# Patient Record
Sex: Female | Born: 1970 | Race: White | Hispanic: No | Marital: Married | Smoking: Never smoker
Health system: Southern US, Community
[De-identification: ages and names within clinical notes are randomized; demographics above are authoritative.]

## PROBLEM LIST (undated history)

## (undated) DIAGNOSIS — I8390 Asymptomatic varicose veins of unspecified lower extremity: Secondary | ICD-10-CM

## (undated) DIAGNOSIS — I10 Essential (primary) hypertension: Secondary | ICD-10-CM

## (undated) HISTORY — DX: Essential (primary) hypertension: I10

## (undated) HISTORY — PX: NO PAST SURGERIES: SHX2092

## (undated) HISTORY — DX: Asymptomatic varicose veins of unspecified lower extremity: I83.90

---

## 2011-10-24 ENCOUNTER — Encounter: Payer: Self-pay | Admitting: Emergency Medicine

## 2011-10-24 ENCOUNTER — Emergency Department (HOSPITAL_BASED_OUTPATIENT_CLINIC_OR_DEPARTMENT_OTHER)
Admission: EM | Admit: 2011-10-24 | Discharge: 2011-10-24 | Disposition: A | Discharge status: Home / Self Care | Payer: Medicaid Other | Attending: Emergency Medicine | Admitting: Emergency Medicine

## 2011-10-24 DIAGNOSIS — O200 Threatened abortion: Secondary | ICD-10-CM

## 2011-10-24 DIAGNOSIS — B9689 Other specified bacterial agents as the cause of diseases classified elsewhere: Secondary | ICD-10-CM

## 2011-10-24 DIAGNOSIS — A499 Bacterial infection, unspecified: Secondary | ICD-10-CM | POA: Insufficient documentation

## 2011-10-24 DIAGNOSIS — O99891 Other specified diseases and conditions complicating pregnancy: Secondary | ICD-10-CM | POA: Insufficient documentation

## 2011-10-24 DIAGNOSIS — N760 Acute vaginitis: Secondary | ICD-10-CM | POA: Insufficient documentation

## 2011-10-24 DIAGNOSIS — O2390 Unspecified genitourinary tract infection in pregnancy, unspecified trimester: Secondary | ICD-10-CM | POA: Insufficient documentation

## 2011-10-24 LAB — URINE MICROSCOPIC-ADD ON

## 2011-10-24 LAB — WET PREP, GENITAL

## 2011-10-24 LAB — URINALYSIS, ROUTINE W REFLEX MICROSCOPIC
Bilirubin Urine: NEGATIVE
Hgb urine dipstick: NEGATIVE
Specific Gravity, Urine: 1.019 (ref 1.005–1.030)
pH: 6.5 (ref 5.0–8.0)

## 2011-10-24 MED ORDER — NITROFURANTOIN MACROCRYSTAL 100 MG PO CAPS
100.0000 mg | ORAL_CAPSULE | Freq: Four times a day (QID) | ORAL | Status: DC
  Filled 2011-10-24: qty 1

## 2011-10-24 MED ORDER — ONDANSETRON 4 MG PO TBDP: 4.0000 mg | ORAL_TABLET | Freq: Three times a day (TID) | ORAL | Status: AC | PRN

## 2011-10-24 MED ORDER — NITROFURANTOIN MACROCRYSTAL 100 MG PO CAPS: 100.0000 mg | ORAL_CAPSULE | Freq: Two times a day (BID) | ORAL | Status: AC

## 2011-10-24 MED ORDER — NITROFURANTOIN MONOHYD MACRO 100 MG PO CAPS
ORAL_CAPSULE | ORAL | Status: AC
  Administered 2011-10-24: 100 mg
  Filled 2011-10-24: qty 1

## 2011-10-24 MED ORDER — METRONIDAZOLE 500 MG PO TABS: 500.0000 mg | ORAL_TABLET | Freq: Three times a day (TID) | ORAL | Status: AC

## 2011-10-24 MED ORDER — ONDANSETRON 8 MG PO TBDP
8.0000 mg | ORAL_TABLET | ORAL | Status: AC
  Administered 2011-10-24: 8 mg via ORAL
  Filled 2011-10-24: qty 1

## 2011-10-24 NOTE — ED Notes (Signed)
Pt reports lower abd pain and nausea with pregnancy denies any bleeding

## 2011-10-24 NOTE — ED Notes (Signed)
Care plan reviewed with Pt will return as needed

## 2011-10-24 NOTE — ED Provider Notes (Signed)
History     CSN: [id number] Arrival date & time: 10/24/2011 10:52 AM   First MD Initiated Contact with Patient 10/24/11 1148      Chief Complaint  Patient presents with  . Emesis During Pregnancy    pt reports lower abd pain and cramping with nausea reports she is 2 Months pregnant no prenatal care    (Consider location/radiation/quality/duration/timing/severity/associated sxs/prior treatment) HPI Patient presents with complaint of lower abdominal pain. She states her last menstrual period was August 15. She knows that she is pregnant. She does not have any prenatal care. She denies any vaginal bleeding. Abdominal pain began 2 days ago and has been constant and dull in nature. Pain is described as mild to moderate in severity. She has had intermittent vomiting but is able to keep down liquids. She denies any fever. She denies any dysuria. Patient is G6 P4 A1- hx of one prior miscarriage  History reviewed. No pertinent past medical history.  History reviewed. No pertinent past surgical history.  History reviewed. No pertinent family history.  History  Substance Use Topics  . Smoking status: Never Smoker   . Smokeless tobacco: Not on file  . Alcohol Use: No    OB History    Grav Para Term Preterm Abortions TAB SAB Ect Mult Living   1               Review of Systems ROS reviewed and otherwise negative except for mentioned in HPI  Allergies  Review of patient's allergies indicates no known allergies.  Home Medications   Current Outpatient Rx  Name Route Sig Dispense Refill  . METRONIDAZOLE 500 MG PO TABS Oral Take 1 tablet (500 mg total) by mouth 3 (three) times daily. 21 tablet 0  . NITROFURANTOIN MACROCRYSTAL 100 MG PO CAPS Oral Take 1 capsule (100 mg total) by mouth 2 (two) times daily. 14 capsule 0  . ONDANSETRON 4 MG PO TBDP Oral Take 1 tablet (4 mg total) by mouth every 8 (eight) hours as needed for nausea. 20 tablet 0    BP 127/86  Pulse 79  Temp 98.4 F  (36.9 C)  Resp 22  Wt 157 lb (71.215 kg)  SpO2 100%  LMP 07/26/2011 Vitals reviewed Physical Exam Physical Examination: Physical Examination: General appearance - alert, well appearing, and in no distress Mental status - alert, oriented to person, place, and time Mouth - mucous membranes moist, pharynx normal without lesions Chest - clear to auscultation, no wheezes, rales or rhonchi, symmetric air entry Heart - normal rate, regular rhythm, normal S1, S2, no murmurs, rubs, clicks or gallops Abdomen - soft, nondistended, no masses or organomegaly, mild ttp diffusely over lower abdomen- suprapubic and bilaterally, no gaurding or rebound Pelvic - normal external genitalia, vulva, cervical os closed, no blood, uterus enlarged/gravid, no adnexal tenderness, fundal tenderness or CMT Back exam - full range of motion, no tenderness, palpable spasm or pain on motion, no midline ttp , no CVA tenderness Neurological - alert, oriented, normal speech, no focal findings or movement disorder noted, motor and sensory grossly normal bilaterally Musculoskeletal - no joint tenderness, deformity or swelling Extremities - peripheral pulses normal, no pedal edema, no clubbing or cyanosis Skin - normal coloration and turgor, no rashes, no suspicious skin lesions noted  ED Course  Procedures (including critical care time)  Labs Reviewed  URINALYSIS, ROUTINE W REFLEX MICROSCOPIC - Abnormal; Notable for the following:    Appearance CLOUDY (*)    Leukocytes, UA MODERATE (*)  All other components within normal limits  URINE MICROSCOPIC-ADD ON - Abnormal; Notable for the following:    Squamous Epithelial / LPF FEW (*)    Bacteria, UA MANY (*)    All other components within normal limits  WET PREP, GENITAL - Abnormal; Notable for the following:    Yeast, Wet Prep FEW (*)    Clue Cells, Wet Prep MODERATE (*)    WBC, Wet Prep HPF POC MANY (*)    All other components within normal limits  PREGNANCY, URINE    GC/CHLAMYDIA PROBE AMP, GENITAL  URINE CULTURE   No results found.   1. Threatened miscarriage   2. Bacterial vaginosis   3. Asymptomatic bacteriuria in pregnancy    EMERGENCY DEPARTMENT [place] PREGNANCY "Study: Limited Ultrasound of the Pelvis"  INDICATIONS:Pregnancy(required) and Pelvic pain Multiple views of the uterus and pelvic cavity are obtained with a multi-frequency probe.  APPROACH:Transabdominal   PERFORMED BY: Myself  IMAGES ARCHIVED?: Yes  LIMITATIONS: Emergent procedure  PREGNANCY FREE FLUID: None  PREGNANCY UTERUS FINDINGS:Uterus enlarged ADNEXAL FINDINGS:Left ovary not seen and Right ovary not seen  PREGNANCY FINDINGS: Intrauterine gestational sac noted, Fetal pole present and Fetal heart activity seen  INTERPRETATION: Viable intrauterine pregnancy  GESTATIONAL AGE, ESTIMATE: 14 weeks 5 days  FETAL HEART RATE: 143  COMMENT(Estimate of Gestational Age):  Viable IUP with no other acute abnormalities found     MDM  Pt presenting with c/o pregnancy and lower abdominal pain.  Bedside ultrasound performed by me shows +IUP at 14weeks 5 days, FHR 143.  Cervical os closed on pelvic exam.  Pt with asymptomatic bacteriuria- started on macrobid- urine culture sent.  Wet prep also c/o BV- started on flagyl.  Strongly recommended pt start prenatal care- given f/u information.  Discharged with strict return precautions.  Pt agreeable with plan.        Ethelda Chick, MD 10/24/11 [phone]

## 2011-10-26 LAB — URINE CULTURE: Culture  Setup Time: 201211111046

## 2014-10-15 ENCOUNTER — Encounter: Payer: Self-pay | Admitting: Emergency Medicine

## 2016-09-03 ENCOUNTER — Encounter: Payer: Self-pay | Admitting: Obstetrics and Gynecology

## 2016-09-03 ENCOUNTER — Other Ambulatory Visit: Payer: Self-pay

## 2016-09-03 ENCOUNTER — Ambulatory Visit (INDEPENDENT_AMBULATORY_CARE_PROVIDER_SITE_OTHER): Payer: Medicaid Other | Admitting: Obstetrics and Gynecology

## 2016-09-03 VITALS — BP 121/82 | HR 84 | Ht 67.0 in | Wt 185.4 lb

## 2016-09-03 DIAGNOSIS — R928 Other abnormal and inconclusive findings on diagnostic imaging of breast: Secondary | ICD-10-CM | POA: Insufficient documentation

## 2016-09-03 DIAGNOSIS — O99210 Obesity complicating pregnancy, unspecified trimester: Secondary | ICD-10-CM | POA: Insufficient documentation

## 2016-09-03 DIAGNOSIS — O10912 Unspecified pre-existing hypertension complicating pregnancy, second trimester: Secondary | ICD-10-CM | POA: Diagnosis not present

## 2016-09-03 DIAGNOSIS — O09529 Supervision of elderly multigravida, unspecified trimester: Secondary | ICD-10-CM | POA: Insufficient documentation

## 2016-09-03 DIAGNOSIS — O30002 Twin pregnancy, unspecified number of placenta and unspecified number of amniotic sacs, second trimester: Secondary | ICD-10-CM

## 2016-09-03 DIAGNOSIS — O30049 Twin pregnancy, dichorionic/diamniotic, unspecified trimester: Secondary | ICD-10-CM | POA: Insufficient documentation

## 2016-09-03 DIAGNOSIS — O09522 Supervision of elderly multigravida, second trimester: Secondary | ICD-10-CM | POA: Diagnosis not present

## 2016-09-03 DIAGNOSIS — I10 Essential (primary) hypertension: Secondary | ICD-10-CM | POA: Insufficient documentation

## 2016-09-03 LAB — POCT URINALYSIS DIP (DEVICE)
Bilirubin Urine: NEGATIVE
GLUCOSE, UA: NEGATIVE mg/dL
KETONES UR: NEGATIVE mg/dL
LEUKOCYTES UA: NEGATIVE
Nitrite: NEGATIVE
Protein, ur: NEGATIVE mg/dL
SPECIFIC GRAVITY, URINE: 1.02 (ref 1.005–1.030)
Urobilinogen, UA: 1 mg/dL (ref 0.0–1.0)
pH: 7 (ref 5.0–8.0)

## 2016-09-03 MED ORDER — ASPIRIN 81 MG PO CHEW: 81.0000 mg | CHEWABLE_TABLET | Freq: Every day | ORAL | 3 refills | Status: AC

## 2016-09-03 MED ORDER — FOLIC ACID 1 MG PO TABS: 1.0000 mg | ORAL_TABLET | Freq: Every day | ORAL | 3 refills | Status: DC

## 2016-09-03 NOTE — Progress Notes (Signed)
New OB Note  09/03/2016   Clinic: [facility]  Chief Complaint: NOB  Transfer of Care Patient: Yes, Dr[facility] Shawnie Pons of [facility]  History of Present Illness: Lori Peck is a 45 y.o. [id number] @ 17/6 weeks (EDC 2/23, based on Patient's last menstrual period was 05/01/2016 (exact date).=13wk u/s), with the above CC. Preg complicated by has Supervision of high-risk pregnancy of elderly multigravida; Multiple pregnancy; Chronic hypertension; and Obesity in pregnancy on her problem list.   She was using no method when she conceived.  She has Negative signs or symptoms of nausea/vomiting of pregnancy. She has Negative signs or symptoms of miscarriage or preterm labor She identifies Negative Zika risk factors for her and her partner On any different medications around the time she conceived/early pregnancy: No   ROS: A 12-point review of systems was performed and negative, except as stated in the above HPI.  OBGYN History: As per HPI. OB History  Gravida Para Term Preterm AB Living  7 5 5   1 4   SAB TAB Ectopic Multiple Live Births  1       5    # Outcome Date GA Lbr Len/2nd Weight Sex Delivery Anes PTL Lv  7 Current           6 SAB 11/2015 [redacted]w[redacted]d         5 Term 04/15/12 [redacted]w[redacted]d  5 lb (2.268 kg) F Vag-Spont EPI N LIV  4 Term 12/16/07 [redacted]w[redacted]d  7 lb (3.175 kg) F Vag-Spont None N LIV  3 Term 12/15/98 [redacted]w[redacted]d  6 lb (2.722 kg) F Vag-Spont  N LIV  2 Term 06/02/96 [redacted]w[redacted]d  5 lb (2.268 kg) M Vag-Spont   LIV  1 Term 12/01/94 [redacted]w[redacted]d  7 lb (3.175 kg) M Vag-Spont   DEC      Any issues with any prior pregnancies: no Any prior children are healthy, doing well, without any problems or issues: yes History of pap smears: Yes. Last pap smear 2017, negative. Abnormal: No h/o  History of STIs: No   Past Medical History: Past Medical History:  Diagnosis Date  . Hypertension   . Varicosities of leg     Past Surgical History: Past Surgical History:  Procedure Laterality Date  . NO PAST  SURGERIES      Family History:  History reviewed. No pertinent family history. She denies any history of mental retardation, birth defects or genetic disorders in her or the FOB's history  Social History:  Social History   Social History  . Marital status: Married    Spouse name: N/A  . Number of children: N/A  . Years of education: N/A   Occupational History  . Not on file.   Social History Main Topics  . Smoking status: Never Smoker  . Smokeless tobacco: Never Used  . Alcohol use No  . Drug use: No  . Sexual activity: Yes    Birth control/ protection: None   Other Topics Concern  . Not on file   Social History Narrative  . 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 Fundal  height: not applicable FHTs: normal x2  General appearance: Well nourished, well developed female in no acute distress.   Pelvic exam: is not limited by body habitus EGBUS: within normal limits, Vagina: within normal limits and with no blood in the vault, Cervix: normal appearing cervix without discharge or lesions, closed/long/high, Uterus:  enlarged, c/w 16 week size, and Adnexa:  normal adnexa and no mass, fullness, tenderness  Laboratory: Outside records @ dr. Shawnie Ponsorn: negative [place], UCx, early 1hr (122). O pos/RI/hepB neg/rpr neg/hiv neg/pap neg Patient went to er on 9/3 for decreased FM and negative u/a, cbc, cmp  Imaging:  As above  Assessment: pt doing well  Plan: 1. Twin gestation in second trimester, unspecified placenta and amniotic sac number Routine care. Unsure of placentation (ER stated ?mo-di) and no formal u/s done. Extra FA added. She had harmony testing  but cffdna isn't recommended for twins so will do quad screen. Routine AP care based on u/s.  - US MFM OB COMP + 14 WK; Future - US MFM OB COMP ADDL GEST + 14 WK; Future  2. Supervision of high-risk pregnancy of elderly multigravida Will get maternal screening echo and EKG. Start baby asa today. PC ratio today. Advised TED hoses and VTE precautions, s/s given. Will defer to mfm to see if she needs fetal echo.  - AMB referral to maternal fetal medicine - AFP, Quad Screen - EKG 12-Lead - ECHOCARDIOGRAM COMPLETE; Future  3. Chronic hypertension Continue hctz 25mg  qday. See above  4. Obesity in pregnancy See above  5. History of abnormal breast u/s 03/2016 [facility] one recommended f/u in 4368m which she hasn't had done.  - US BREAST COMPLETE UNI LEFT INC AXILLA; Future  Problem list reviewed and updated.  Follow up in 2 weeks.  >50% of 35 min visit spent on counseling and coordination of care.     Lori Copaharlie Ledia Peck, Jr. MD Attending [facility](Faculty Practice)

## 2016-09-03 NOTE — Progress Notes (Signed)
Declines flu vaccine today.

## 2016-09-03 NOTE — Progress Notes (Signed)
Breast US scheduled for October 6th @ 0840 ROI filled out and faxed to [facility] for breast ultrasound records.

## 2016-09-07 LAB — AFP, QUAD SCREEN
AFP: 96.8 ng/mL
Curr Gest Age: 17.9 weeks
HCG TOTAL: 37.5 [IU]/mL
INH: 524.9 pg/mL
Interpretation-AFP: POSITIVE — AB
MOM FOR HCG: 1.48
MoM for AFP: 2.62
MoM for INH: 3.4
Open Spina bifida: NEGATIVE
UE3 MOM: 1.12
UE3 VALUE: 1.35 ng/mL

## 2016-09-08 ENCOUNTER — Encounter (HOSPITAL_COMMUNITY): Payer: Self-pay | Admitting: Obstetrics and Gynecology

## 2016-09-08 ENCOUNTER — Encounter: Payer: Self-pay | Admitting: Obstetrics and Gynecology

## 2016-09-08 DIAGNOSIS — O280 Abnormal hematological finding on antenatal screening of mother: Secondary | ICD-10-CM | POA: Insufficient documentation

## 2016-09-15 ENCOUNTER — Encounter (HOSPITAL_COMMUNITY): Payer: Self-pay

## 2016-09-15 ENCOUNTER — Other Ambulatory Visit: Payer: Self-pay | Admitting: Obstetrics and Gynecology

## 2016-09-15 ENCOUNTER — Ambulatory Visit (HOSPITAL_COMMUNITY)
Admission: RE | Admit: 2016-09-15 | Discharge: 2016-09-15 | Disposition: A | Discharge status: Home / Self Care | Payer: Medicaid Other | Source: Ambulatory Visit | Attending: Obstetrics and Gynecology | Admitting: Obstetrics and Gynecology

## 2016-09-15 ENCOUNTER — Other Ambulatory Visit (HOSPITAL_COMMUNITY): Payer: Medicaid Other

## 2016-09-15 DIAGNOSIS — O30002 Twin pregnancy, unspecified number of placenta and unspecified number of amniotic sacs, second trimester: Secondary | ICD-10-CM

## 2016-09-15 DIAGNOSIS — Z315 Encounter for genetic counseling: Secondary | ICD-10-CM | POA: Diagnosis present

## 2016-09-15 DIAGNOSIS — Z363 Encounter for antenatal screening for malformations: Secondary | ICD-10-CM

## 2016-09-15 DIAGNOSIS — O10919 Unspecified pre-existing hypertension complicating pregnancy, unspecified trimester: Secondary | ICD-10-CM

## 2016-09-15 DIAGNOSIS — O09522 Supervision of elderly multigravida, second trimester: Secondary | ICD-10-CM | POA: Diagnosis not present

## 2016-09-15 DIAGNOSIS — Z3A19 19 weeks gestation of pregnancy: Secondary | ICD-10-CM

## 2016-09-15 DIAGNOSIS — O30042 Twin pregnancy, dichorionic/diamniotic, second trimester: Secondary | ICD-10-CM | POA: Insufficient documentation

## 2016-09-15 DIAGNOSIS — O09529 Supervision of elderly multigravida, unspecified trimester: Secondary | ICD-10-CM

## 2016-09-15 NOTE — Progress Notes (Signed)
Genetic Counseling  High-Risk Gestation Note  Appointment Date:  09/15/2016 Referred By: Sagamore BingPickens, Charlie, MD Date of Birth:  1971-09-13   Pregnancy History: [id number] Estimated Date of Delivery: 02/05/17 Estimated Gestational Age: 3559w4d Attending: Particia NearingMartha Decker, MD   Lori. Lori Peck  was seen for genetic counseling because of a maternal age of 45 y.o. in a dichorionic/diamniotic twin gestation.     In summary:  Discussed increased risk for fetal aneuploidy with advanced maternal age  Reviewed results of patient's NIPS and ultrasound  Patient also previously had Quad screen, which was screen positive for Down syndrome but reduced age -related risk  Reviewed that while both have decreased sensitivity in twin gestation, NIPS has better accuracy than Quad screen for fetal aneuploidy  Discussed limitations of screening and option of diagnostic testing  Declined amniocentesis  Advanced paternal age - 47 years old  Reviewed carrier screening options- patient declined detailed discussion   She was counseled regarding maternal age and the association with risk for chromosome conditions due to nondisjunction with aging of the ova.   We reviewed chromosomes, nondisjunction, and the associated 1 in 11 risk for fetal aneuploidy related to a maternal age of 45 y.o. at 4759w4d gestation.  She was counseled that the risk for aneuploidy decreases as gestational age increases, accounting for those pregnancies which spontaneously abort.  We specifically discussed Down syndrome (trisomy 6121), trisomies 3413 and 8118, and sex chromosome aneuploidies (47,XXX and 47,XXY) including the common features and prognoses of each.   We also reviewed the results of Lori Peck's non-invasive prenatal screening (NIPS) and ultrasound.  We discussed that NIPS analyzes placental cell free DNA in maternal circulation to evaluate for the presence of extra chromosome conditions.  Thus, it is able to provide risk assessment for  specific chromosome conditions, but is not diagnostic.  Specifically, Lori. Carney Peck had [facility] through [facility] with Y analysis.  Based on this test, the chance for the pregnancy to have aneuploidy for chromosomes 13, 18 or 21 was reduced to less than 1 in [id number].  This screen did not evaluate for the presence of extra or missing DNA from the X chromosome due to the twin gestation, thus the chance for fetal X chromosome aneuploidy was not evaluated. We reviewed that this screen is highly sensitive and specific for these conditions but is not diagnostic. We reviewed that the detection rate is lower in a twin gestation compared to a singleton, but the detection rate for Down syndrome is approximately 90% in twin gestations.  She was counseled that 50-80% of fetuses with Down syndrome and up to 90% of fetuses with trisomies 13 and 18, when well visualized, have detectable anomalies or soft markers by ultrasound.   Quad screen was performed through the patient's OB office. We reviewed these results, which were screen positive for Down syndrome but reduced the risk from the patient's age related risk (1 in 4714 to 1 in 7171). We reviewed that screening adjusts the patient's a priori age related risk for specific conditions to provide a pregnancy specific estimate. It is not considered diagnostic and does not assess for all chromosome conditions. We reviewed that the detection rate for Down syndrome in a singleton pregnancy is approximately 75-80%, but that this is reduced in a twin gestation. Trisomy 18 risk is not assessed from Quad screen in a twin gestation. Thus, we reviewed that while neither NIPS nor Quad screen is diagnostic and does not assess for all chromosome conditions, NIPS has a higher  sensitivity and higher specificity for these chromosome conditions compared to Quad screen.   Detailed ultrasound was performed today. Visualized fetal anatomy appeared within normal limits.  Complete ultrasound results  reported under separate cover.   We also discussed the availability of diagnostic testing by way of amniocentesis.  We reviewed the risks, benefits and limitations of amniocentesis including the approximate 1 in 300-500 risk for pregnancy complications following amniocentesis. We discussed the possible results that the tests might provide including: positive, negative, unanticipated, and no result. Finally, they were counseled regarding the cost of each option and potential out of pocket expenses. After reviewing the above results and the available options, Lori Peck expressed that she is not interested in pursuing diagnostic testing at this time or in the future, given the associated risk of complications.  She understands that ultrasound and NIPS cannot rule out all birth defects or genetic syndromes.    Lori Peck was provided with written information regarding cystic fibrosis (CF), spinal muscular atrophy (SMA) and hemoglobinopathies including the carrier frequency, availability of carrier screening and prenatal diagnosis if indicated.  In addition, we discussed that CF and hemoglobinopathies are routinely screened for as part of the [place] newborn screening panel.  She declined screening for CF, SMA and hemoglobinopathies.  Both family histories were reviewed and found to be noncontributory for birth defects, intellectual disability, and known genetic conditions. Without further information regarding the provided family history, an accurate genetic risk cannot be calculated. Further genetic counseling is warranted if more information is obtained.  The father of the pregnancy is reportedly 65 years old. We discussed that advanced paternal age (APA) is defined as paternal age greater than or equal to age 9.  Recent large-scale sequencing studies have shown that approximately 80% of de novo point mutations are of paternal origin.  Many studies have demonstrated a strong correlation between  increased paternal age and de novo point mutations.  Although no specific data is available regarding fetal risks for fathers 62+ years old at conception, it is apparent that the overall risk for single gene conditions is increased.  To estimate the relative increase in risk of a genetic disorder with APA, the heritability of the disease must be considered.  Assuming an approximate 2x increase in risk for conditions that are exclusively paternal in origin, the risk for each individual condition is still relatively low.  It is estimated that the overall chance for a de novo mutation is ~0.5%.  We also discussed the wide range of conditions which can be caused by new dominant gene mutations (achondroplasia, neurofibromatosis, Marfan syndrome etc.).  She was counseled that genetic testing for each individual single gene condition is not warranted or available unless ultrasound or family history concerns lend suspicion to a specific condition.    In addition, she was counseled that some literature suggests that APA is also associated with an increase in risk for fetal aneuploidy.  While other literature does not support this, we discussed that a specific risk for aneuploidy other than that based on maternal age cannot be quantified.  We again reviewed the NIPS result that was previously performed for Lori Peck, which was within normal range for Down syndrome, trisomy 26, and trisomy 65. Lastly, we discussed that newer literature suggests that the risk for autism spectrum disorders (ASD) may be increased in children born to fathers of APA.  We discussed that ASDs are among the most common neurodevelopmental disorders, with approximately 1 in 85 children meeting criteria  for ASD.  Approximately 80% of individuals diagnosed are female.  There is strong evidence that genetic factors play a critical role in development of ASD.  While there have been recent advances in identifying specific genetic causes of ASD, there are  still many individuals for whom the etiology of the ASD is not known.  They understand that at this time there is no reliable, comprehensive genetic testing available for ASD.     Lori Peck denied exposure to environmental toxins or chemical agents. She denied the use of alcohol, tobacco or street drugs. She denied significant viral illnesses during the course of her pregnancy. Her medical and surgical histories were noncontributory.   I counseled Lori Peck regarding the above risks and available options.  The approximate face-to-face time with the genetic counselor was 40 minutes.  Quinn Plowman, Lori,  Certified Genetic Counselor 09/15/2016

## 2016-09-16 ENCOUNTER — Encounter: Payer: Self-pay | Admitting: Obstetrics and Gynecology

## 2016-09-16 ENCOUNTER — Other Ambulatory Visit (HOSPITAL_COMMUNITY): Payer: Medicaid Other

## 2016-09-16 ENCOUNTER — Other Ambulatory Visit (HOSPITAL_COMMUNITY): Payer: Self-pay | Admitting: *Deleted

## 2016-09-16 DIAGNOSIS — O09529 Supervision of elderly multigravida, unspecified trimester: Secondary | ICD-10-CM | POA: Insufficient documentation

## 2016-09-16 DIAGNOSIS — O30049 Twin pregnancy, dichorionic/diamniotic, unspecified trimester: Secondary | ICD-10-CM

## 2016-09-17 ENCOUNTER — Other Ambulatory Visit (HOSPITAL_COMMUNITY): Payer: Self-pay

## 2016-09-18 ENCOUNTER — Other Ambulatory Visit: Payer: Self-pay | Admitting: Obstetrics and Gynecology

## 2016-09-18 ENCOUNTER — Ambulatory Visit
Admission: RE | Admit: 2016-09-18 | Discharge: 2016-09-18 | Disposition: A | Discharge status: Home / Self Care | Payer: Medicaid Other | Source: Ambulatory Visit | Attending: Obstetrics and Gynecology | Admitting: Obstetrics and Gynecology

## 2016-09-18 DIAGNOSIS — O30002 Twin pregnancy, unspecified number of placenta and unspecified number of amniotic sacs, second trimester: Secondary | ICD-10-CM

## 2016-09-18 DIAGNOSIS — R599 Enlarged lymph nodes, unspecified: Secondary | ICD-10-CM

## 2016-09-18 DIAGNOSIS — R928 Other abnormal and inconclusive findings on diagnostic imaging of breast: Secondary | ICD-10-CM

## 2016-09-21 ENCOUNTER — Other Ambulatory Visit: Payer: Self-pay | Admitting: Obstetrics and Gynecology

## 2016-09-21 ENCOUNTER — Ambulatory Visit
Admission: RE | Admit: 2016-09-21 | Discharge: 2016-09-21 | Disposition: A | Discharge status: Home / Self Care | Payer: Medicaid Other | Source: Ambulatory Visit | Attending: Obstetrics and Gynecology | Admitting: Obstetrics and Gynecology

## 2016-09-21 DIAGNOSIS — O30002 Twin pregnancy, unspecified number of placenta and unspecified number of amniotic sacs, second trimester: Secondary | ICD-10-CM

## 2016-09-21 DIAGNOSIS — R599 Enlarged lymph nodes, unspecified: Secondary | ICD-10-CM

## 2016-09-23 ENCOUNTER — Telehealth: Payer: Self-pay | Admitting: *Deleted

## 2016-09-23 DIAGNOSIS — R928 Other abnormal and inconclusive findings on diagnostic imaging of breast: Secondary | ICD-10-CM

## 2016-09-23 NOTE — Telephone Encounter (Addendum)
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 States this shows there is an inflammatory process going on - could be TB or sarcoidosis. Recommend blood test  For tb and whatever other follow up Dr. Vergie LivingPickens reccommends. Will forward this as high priority to Dr. Vergie LivingPickens and Dr. Alysia PennaErvin as patient in scheduled to come in tomorrow. Put notes on her appt we need to do blood test and discuss results/ plan of care with pt. Also updated problem list. [facility] will call patient to discuss with her results and to follow up with our office.

## 2016-09-24 ENCOUNTER — Other Ambulatory Visit: Payer: Self-pay

## 2016-09-24 ENCOUNTER — Ambulatory Visit (HOSPITAL_COMMUNITY): Payer: Medicaid Other | Attending: Cardiology

## 2016-09-24 ENCOUNTER — Ambulatory Visit (INDEPENDENT_AMBULATORY_CARE_PROVIDER_SITE_OTHER): Payer: Medicaid Other | Admitting: Obstetrics and Gynecology

## 2016-09-24 ENCOUNTER — Telehealth: Payer: Self-pay | Admitting: Internal Medicine

## 2016-09-24 VITALS — BP 131/76 | Wt 189.4 lb

## 2016-09-24 DIAGNOSIS — I10 Essential (primary) hypertension: Secondary | ICD-10-CM

## 2016-09-24 DIAGNOSIS — O10912 Unspecified pre-existing hypertension complicating pregnancy, second trimester: Secondary | ICD-10-CM

## 2016-09-24 DIAGNOSIS — O30002 Twin pregnancy, unspecified number of placenta and unspecified number of amniotic sacs, second trimester: Secondary | ICD-10-CM | POA: Insufficient documentation

## 2016-09-24 DIAGNOSIS — O09529 Supervision of elderly multigravida, unspecified trimester: Secondary | ICD-10-CM | POA: Insufficient documentation

## 2016-09-24 DIAGNOSIS — Z3A28 28 weeks gestation of pregnancy: Secondary | ICD-10-CM | POA: Insufficient documentation

## 2016-09-24 DIAGNOSIS — O30042 Twin pregnancy, dichorionic/diamniotic, second trimester: Secondary | ICD-10-CM

## 2016-09-24 DIAGNOSIS — O09522 Supervision of elderly multigravida, second trimester: Secondary | ICD-10-CM

## 2016-09-24 DIAGNOSIS — R590 Localized enlarged lymph nodes: Secondary | ICD-10-CM

## 2016-09-24 LAB — ECHOCARDIOGRAM COMPLETE
AVLVOTPG: 8 mmHg
CHL CUP RV SYS PRESS: 31 mmHg
E decel time: 197 msec
E/e' ratio: 7.33
FS: 25 % — AB (ref 28–44)
IVS/LV PW RATIO, ED: 0.88
LA diam index: 1.93 cm/m2
LA vol A4C: 65 ml
LA vol: 76 mL
LASIZE: 38 mm
LAVOLIN: 38.6 mL/m2
LDCA: 3.14 cm2
LEFT ATRIUM END SYS DIAM: 38 mm
LV E/e'average: 7.33
LV PW d: 11 mm — AB (ref 0.6–1.1)
LV TDI E'MEDIAL: 7.35
LVEEMED: 7.33
LVELAT: 15 cm/s
LVOT VTI: 24.4 cm
LVOT diameter: 20 mm
LVOTPV: 139 cm/s
LVOTSV: 77 mL
MV Dec: 197
MV pk E vel: 110 m/s
MVPG: 5 mmHg
MVPKAVEL: 71.6 m/s
Reg peak vel: 263 cm/s
TDI e' lateral: 15
TR max vel: 263 cm/s

## 2016-09-24 MED ORDER — FOLIC ACID 1 MG PO TABS: 1.0000 mg | ORAL_TABLET | Freq: Every day | ORAL | 3 refills | Status: AC

## 2016-09-24 NOTE — Progress Notes (Signed)
[facility] to [facility] demographics, medical records pertaining to diagnoses to Clydie BraunKaren @ [phone].  [facility] to contact pt in regards to appt.

## 2016-09-24 NOTE — Telephone Encounter (Signed)
Was called by obstetrician who mentioned that the patient underwent LN needle biopsy of L axillary node found to have focal necrotizing granulmoatous lymphadenitis in patient who is 44yo F @ [redacted] wks gestation with twins. 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 No recent CXR. Patient reportedly does not have any pulmonary symptoms.  I have contacted the [facility], TB team, and they will start investigation and assess for active/ extra pulm TB.  In the meantime, since no tissue was sent for culture, I have recommended that the patient undergo excisional LN biopsy to have tissue sent for AFB and fungal culture. Will contact [facility] to see if they will be able to fulfill our request sooner than later in order to decide if patient start treatment during pregnancy

## 2016-09-24 NOTE — Progress Notes (Signed)
Declined Flu 

## 2016-09-24 NOTE — Progress Notes (Signed)
Subjective:  [place] is a 45 y.o. [id number] at 3148w6d being seen today for ongoing prenatal care.  She is currently monitored for the following issues for this high-risk pregnancy and has Supervision of high-risk pregnancy of elderly multigravida; Dichorionic diamniotic twin gestation; Chronic hypertension; Obesity in pregnancy; Abnormal quad screen; AMA (advanced maternal age) multigravida 35+; and Axillary adenopathy on her problem list. 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 States treated for 6 months. She currently denies any S/Sx of TB or illness.   Patient reports no complaints.  Contractions: Irritability.  .  Movement: Present. Denies leaking of fluid.   The following portions of the patient's history were reviewed and updated as appropriate: allergies, current medications, past family history, past medical history, past social history, past surgical history and problem list. Problem list updated.  Objective:   Vitals:   09/24/16 1248  BP: 131/76  Weight: 189 lb 6.4 oz (85.9 kg)    Fetal Status: Fetal Heart Rate (bpm): 146/150   Movement: Present     General:  Alert, oriented and cooperative. Patient is in no acute distress.  Skin: Skin is warm and dry. No rash noted.   Cardiovascular: Normal heart rate noted  Respiratory: Normal respiratory effort, no problems with respiration noted  Abdomen: Soft, gravid, appropriate for gestational age. Pain/Pressure: Present     Pelvic:  Cervical exam deferred        Extremities: Normal range of motion.  Edema: None  Mental Status: Normal mood and affect. Normal behavior. Normal judgment and thought content.   Urinalysis:      Assessment and Plan:  Pregnancy: [id number] at 4748w6d  1. Supervision of high-risk pregnancy of elderly multigravida   2. Dichorionic diamniotic twin pregnancy in second trimester   3. Chronic hypertension Stable on current treatment Stressed importance of taking BASA 4. Elderly  multigravida in second trimester   5. Axillary adenopathy FNA completed on 09/21/16. Concern for granulomatous lymphadenitis. Discussed with pathology. TB PCR testing added. Discussed with Id. [facility] will contact pt regarding treatment and additional testing. ID also advised referral to [facility] for excision of axillary lymph node for culture. POC reviewed and discussed with pt  Preterm labor symptoms and general obstetric precautions including but not limited to vaginal bleeding, contractions, leaking of fluid and fetal movement were reviewed in detail with the patient. Please refer to After Visit Summary for other counseling recommendations.  Return in about 4 weeks (around 10/22/2016).   Hermina StaggersMichael L Aloria Looper, MD

## 2016-10-13 ENCOUNTER — Encounter (HOSPITAL_COMMUNITY): Payer: Self-pay

## 2016-10-13 ENCOUNTER — Ambulatory Visit (HOSPITAL_COMMUNITY)
Admission: RE | Admit: 2016-10-13 | Discharge: 2016-10-13 | Disposition: A | Discharge status: Home / Self Care | Payer: Medicaid Other | Source: Ambulatory Visit | Attending: Obstetrics and Gynecology | Admitting: Obstetrics and Gynecology

## 2016-10-13 DIAGNOSIS — Z3A23 23 weeks gestation of pregnancy: Secondary | ICD-10-CM | POA: Diagnosis not present

## 2016-10-13 DIAGNOSIS — O30042 Twin pregnancy, dichorionic/diamniotic, second trimester: Secondary | ICD-10-CM | POA: Insufficient documentation

## 2016-10-13 DIAGNOSIS — O09522 Supervision of elderly multigravida, second trimester: Secondary | ICD-10-CM | POA: Insufficient documentation

## 2016-10-13 DIAGNOSIS — O30049 Twin pregnancy, dichorionic/diamniotic, unspecified trimester: Secondary | ICD-10-CM

## 2016-10-14 ENCOUNTER — Other Ambulatory Visit (HOSPITAL_COMMUNITY): Payer: Self-pay | Admitting: *Deleted

## 2016-10-14 DIAGNOSIS — O30049 Twin pregnancy, dichorionic/diamniotic, unspecified trimester: Secondary | ICD-10-CM

## 2016-10-22 ENCOUNTER — Ambulatory Visit (INDEPENDENT_AMBULATORY_CARE_PROVIDER_SITE_OTHER): Payer: Medicaid Other | Admitting: Family

## 2016-10-22 ENCOUNTER — Encounter: Payer: Medicaid Other | Admitting: Obstetrics and Gynecology

## 2016-10-22 VITALS — BP 126/52 | HR 95 | Wt 193.5 lb

## 2016-10-22 DIAGNOSIS — R590 Localized enlarged lymph nodes: Secondary | ICD-10-CM

## 2016-10-22 DIAGNOSIS — O10912 Unspecified pre-existing hypertension complicating pregnancy, second trimester: Secondary | ICD-10-CM

## 2016-10-22 DIAGNOSIS — Z8611 Personal history of tuberculosis: Secondary | ICD-10-CM

## 2016-10-22 DIAGNOSIS — O09522 Supervision of elderly multigravida, second trimester: Secondary | ICD-10-CM | POA: Diagnosis not present

## 2016-10-22 DIAGNOSIS — O280 Abnormal hematological finding on antenatal screening of mother: Secondary | ICD-10-CM

## 2016-10-22 DIAGNOSIS — I10 Essential (primary) hypertension: Secondary | ICD-10-CM

## 2016-10-22 DIAGNOSIS — O30002 Twin pregnancy, unspecified number of placenta and unspecified number of amniotic sacs, second trimester: Secondary | ICD-10-CM | POA: Diagnosis not present

## 2016-10-22 DIAGNOSIS — O30043 Twin pregnancy, dichorionic/diamniotic, third trimester: Secondary | ICD-10-CM

## 2016-10-22 DIAGNOSIS — O09529 Supervision of elderly multigravida, unspecified trimester: Secondary | ICD-10-CM

## 2016-10-22 NOTE — Progress Notes (Signed)
   PRENATAL VISIT NOTE  Subjective:  Jim Carney BernJean is a 45 y.o. [id number] at 2514w6d being seen today for ongoing prenatal care.  She is currently monitored for the following issues for this high-risk pregnancy and has Supervision of high-risk pregnancy of elderly multigravida; Dichorionic diamniotic twin gestation; Chronic hypertension; Obesity in pregnancy; Abnormal quad screen; AMA (advanced maternal age) multigravida 35+; and Axillary adenopathy on her problem list.  Patient reports no complaints.   Denies symptoms related to TB.   Contractions: Irritability.  .  Movement: Present. Denies leaking of fluid.   The following portions of the patient's history were reviewed and updated as appropriate: allergies, current medications, past family history, past medical history, past social history, past surgical history and problem list. Problem list updated.  Objective:   Vitals:   10/22/16 0955  BP: (!) 126/52  Pulse: 95  Weight: 193 lb 8 oz (87.8 kg)    Fetal Status: Fetal Heart Rate (bpm): 140/145 Fundal Height: 32 cm Movement: Present     General:  Alert, oriented and cooperative. Patient is in no acute distress.  Skin: Skin is warm and dry. No rash noted.   Cardiovascular: Normal heart rate noted  Respiratory: Normal respiratory effort, no problems with respiration noted  Abdomen: Soft, gravid, appropriate for gestational age. Pain/Pressure: Present     Pelvic:  Cervical exam deferred        Extremities: Normal range of motion.     Mental Status: Normal mood and affect. Normal behavior. Normal judgment and thought content.   Assessment and Plan:  Pregnancy: [id number] at 7714w6d  1. Abnormal quad screen - Normal [facility]; declined amniocentesis   2. 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 Supervision of high-risk pregnancy of elderly multigravida - Third trimester labs at next visit  4.  Elderly multigravida in second trimester - Continue close monitoring   5. Dichorionic diamniotic twin pregnancy in third trimester - Growth ultrasound scheduled for end of the month  6. Chronic hypertension - Continue HCTZ and baby ASA  Preterm labor symptoms and general obstetric precautions including but not limited to vaginal bleeding, contractions, leaking of fluid and fetal movement were reviewed in detail with the patient. Please refer to After Visit Summary for other counseling recommendations.  Return in 2 weeks (on 11/05/2016) for please have patient sign ROI.   Eino FarberWalidah Kennith GainN Karim, CNM

## 2016-11-10 ENCOUNTER — Ambulatory Visit (HOSPITAL_COMMUNITY)
Admission: RE | Admit: 2016-11-10 | Discharge: 2016-11-10 | Disposition: A | Discharge status: Home / Self Care | Payer: Medicaid Other | Source: Ambulatory Visit | Attending: Obstetrics and Gynecology | Admitting: Obstetrics and Gynecology

## 2016-11-10 ENCOUNTER — Encounter (HOSPITAL_COMMUNITY): Payer: Self-pay

## 2016-11-10 ENCOUNTER — Other Ambulatory Visit (HOSPITAL_COMMUNITY): Payer: Self-pay | Admitting: *Deleted

## 2016-11-10 ENCOUNTER — Other Ambulatory Visit (HOSPITAL_COMMUNITY): Payer: Self-pay | Admitting: Maternal and Fetal Medicine

## 2016-11-10 DIAGNOSIS — Z3A27 27 weeks gestation of pregnancy: Secondary | ICD-10-CM | POA: Insufficient documentation

## 2016-11-10 DIAGNOSIS — O30042 Twin pregnancy, dichorionic/diamniotic, second trimester: Secondary | ICD-10-CM | POA: Diagnosis not present

## 2016-11-10 DIAGNOSIS — O10912 Unspecified pre-existing hypertension complicating pregnancy, second trimester: Secondary | ICD-10-CM | POA: Insufficient documentation

## 2016-11-10 DIAGNOSIS — O09522 Supervision of elderly multigravida, second trimester: Secondary | ICD-10-CM

## 2016-11-10 DIAGNOSIS — O09523 Supervision of elderly multigravida, third trimester: Secondary | ICD-10-CM

## 2016-11-10 DIAGNOSIS — O10019 Pre-existing essential hypertension complicating pregnancy, unspecified trimester: Secondary | ICD-10-CM

## 2016-11-10 DIAGNOSIS — O30049 Twin pregnancy, dichorionic/diamniotic, unspecified trimester: Secondary | ICD-10-CM

## 2016-11-12 ENCOUNTER — Ambulatory Visit (INDEPENDENT_AMBULATORY_CARE_PROVIDER_SITE_OTHER): Payer: Medicaid Other | Admitting: Obstetrics and Gynecology

## 2016-11-12 VITALS — BP 115/75 | HR 81 | Wt 189.0 lb

## 2016-11-12 DIAGNOSIS — O10912 Unspecified pre-existing hypertension complicating pregnancy, second trimester: Secondary | ICD-10-CM | POA: Diagnosis not present

## 2016-11-12 DIAGNOSIS — O09522 Supervision of elderly multigravida, second trimester: Secondary | ICD-10-CM | POA: Diagnosis not present

## 2016-11-12 DIAGNOSIS — I10 Essential (primary) hypertension: Secondary | ICD-10-CM | POA: Diagnosis not present

## 2016-11-12 DIAGNOSIS — R590 Localized enlarged lymph nodes: Secondary | ICD-10-CM

## 2016-11-12 DIAGNOSIS — O30043 Twin pregnancy, dichorionic/diamniotic, third trimester: Secondary | ICD-10-CM

## 2016-11-12 DIAGNOSIS — Z8611 Personal history of tuberculosis: Secondary | ICD-10-CM

## 2016-11-12 DIAGNOSIS — O30002 Twin pregnancy, unspecified number of placenta and unspecified number of amniotic sacs, second trimester: Secondary | ICD-10-CM

## 2016-11-12 DIAGNOSIS — O280 Abnormal hematological finding on antenatal screening of mother: Secondary | ICD-10-CM | POA: Diagnosis not present

## 2016-11-12 DIAGNOSIS — Z23 Encounter for immunization: Secondary | ICD-10-CM | POA: Diagnosis not present

## 2016-11-12 DIAGNOSIS — O09529 Supervision of elderly multigravida, unspecified trimester: Secondary | ICD-10-CM

## 2016-11-12 LAB — CBC
HEMATOCRIT: 33.2 % — AB (ref 35.0–45.0)
Hemoglobin: 10.9 g/dL — ABNORMAL LOW (ref 11.7–15.5)
MCH: 29.5 pg (ref 27.0–33.0)
MCHC: 32.8 g/dL (ref 32.0–36.0)
MCV: 90 fL (ref 80.0–100.0)
MPV: 11.5 fL (ref 7.5–12.5)
Platelets: 188 10*3/uL (ref 140–400)
RBC: 3.69 MIL/uL — ABNORMAL LOW (ref 3.80–5.10)
RDW: 14.2 % (ref 11.0–15.0)
WBC: 6.8 10*3/uL (ref 3.8–10.8)

## 2016-11-12 NOTE — Progress Notes (Signed)
Subjective:  [place] is a 45 y.o. [id number] at 4241w6d being seen today for ongoing prenatal care.  She is currently monitored for the following issues for this high-risk pregnancy and has Supervision of high-risk pregnancy of elderly multigravida; Dichorionic diamniotic twin gestation; Chronic hypertension; Obesity in pregnancy; Abnormal quad screen; AMA (advanced maternal age) multigravida 35+; Axillary adenopathy; and History of tuberculosis on her problem list.  Patient reports leg varicosities.  Contractions: Irritability. Vag. Bleeding: None.  Movement: Present. Denies leaking of fluid.   The following portions of the patient's history were reviewed and updated as appropriate: allergies, current medications, past family history, past medical history, past social history, past surgical history and problem list. Problem list updated.  Objective:   Vitals:   11/12/16 0911  BP: 115/75  Pulse: 81  Weight: 189 lb (85.7 kg)    Fetal Status: Fetal Heart Rate (bpm): 125/144   Movement: Present     General:  Alert, oriented and cooperative. Patient is in no acute distress.  Skin: Skin is warm and dry. No rash noted.   Cardiovascular: Normal heart rate noted  Respiratory: Normal respiratory effort, no problems with respiration noted  Abdomen: Soft, gravid, appropriate for gestational age. Pain/Pressure: Present     Pelvic:  Cervical exam deferred        Extremities: Normal range of motion.  Edema: Trace  Mental Status: Normal mood and affect. Normal behavior. Normal judgment and thought content.   Urinalysis:      Assessment and Plan:  Pregnancy: [id number] at 3641w6d  1. Supervision of high-risk pregnancy of elderly multigravida Support hoses recommended for varicosities - CBC - HIV antibody (with reflex) - RPR - 2Hr GTT w/ 1 Hr Carpenter 75 g - Tdap vaccine greater than or equal to 7yo IM Pt reports moving to [place] next week. Copy of OB records made for pt. Pt informed to continue OB  care in [place]. Needs to be seen in 2-3 weeks 2. Dichorionic diamniotic twin pregnancy in third trimester U/S earlier this week normal growth - CBC - HIV antibody (with reflex) - RPR - 2Hr GTT w/ 1 Hr Carpenter 75 g - Tdap vaccine greater than or equal to 7yo IM  3. Chronic hypertension Stable  Continue with BASA and HCTZ - CBC - HIV antibody (with reflex) - RPR - 2Hr GTT w/ 1 Hr Carpenter 75 g - Tdap vaccine greater than or equal to 7yo IM  4. Elderly multigravida in second trimester  - CBC - HIV antibody (with reflex) - RPR - 2Hr GTT w/ 1 Hr Carpenter 75 g - Tdap vaccine greater than or equal to 7yo IM  5. Abnormal quad screen Normal [facility]  6. Axillary adenopathy All stains were negative for AFB [facility] notified in September of possible untreated Tb. Per pt they have determined no treatment is needed CXR is normal 7. History of tuberculosis As above  Preterm labor symptoms and general obstetric precautions including but not limited to vaginal bleeding, contractions, leaking of fluid and fetal movement were reviewed in detail with the patient. Please refer to After Visit Summary for other counseling recommendations.  No Follow-up on file.   Hermina StaggersMichael L Athanasia Stanwood, MD

## 2016-11-12 NOTE — Progress Notes (Signed)
Patient here for 2 hr gtt today

## 2016-11-13 LAB — 2HR GTT W 1 HR, CARPENTER, 75 G
Glucose, 1 Hr, Gest: 140 mg/dL (ref <180)
Glucose, 2 Hr, Gest: 136 mg/dL (ref <153)
Glucose, Fasting, Gest: 69 mg/dL (ref 65–91)

## 2016-11-13 LAB — RPR

## 2016-11-13 LAB — HIV ANTIBODY (ROUTINE TESTING W REFLEX): HIV 1&2 Ab, 4th Generation: NONREACTIVE

## 2016-11-18 ENCOUNTER — Ambulatory Visit (INDEPENDENT_AMBULATORY_CARE_PROVIDER_SITE_OTHER): Payer: Medicaid Other | Admitting: Internal Medicine

## 2016-11-18 ENCOUNTER — Encounter: Payer: Self-pay | Admitting: Internal Medicine

## 2016-11-18 VITALS — BP 135/74 | HR 98 | Temp 97.8°F | Wt 191.0 lb

## 2016-11-18 DIAGNOSIS — I889 Nonspecific lymphadenitis, unspecified: Secondary | ICD-10-CM | POA: Diagnosis not present

## 2016-11-18 DIAGNOSIS — Z227 Latent tuberculosis: Secondary | ICD-10-CM

## 2016-11-18 DIAGNOSIS — R7611 Nonspecific reaction to tuberculin skin test without active tuberculosis: Secondary | ICD-10-CM

## 2016-11-18 NOTE — Progress Notes (Signed)
Patient ID: [place], female   DOB: Nov 10, 1971, 45 y.o.   MRN: [id number]  HPI [place] is a 45 y.o. [id number] at 4960w5d who is considered high-risk pregnancy due to elderly multigravida with Dichorionic diamniotic twin gestation with Abnormal quad screen. She has hx of left axillary lymphadenopathy for which she underwent axillary LN FNA with path showing FOCALLY NECROTIZING GRANULOMATOUS LYMPHADENITIS. Since her procedure, she has noticed bloody drainage from her left breast. She states that she has had left breast drainage of clear fluid that is long standing for 3 years but only bloody of late.  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 She was to get excisional LN biopsy through general surgery in oct/nov but appears that did not get done.  She denies any fever, chills, nightsweats, only fatigue associated with pregnancy. She denies any increase in left axillary lymph node.  Yesterday, She was started on DOT for mTB by [facility] on 11/17/2016.  Social hx: [place], flight out to be with her boyfriend, FOB, to establish residency. They are in the process of buying a house. She works part-time at [employer] and has transferred her job. She has 47 yo and 47 yo who is cared by her mother here in [place]. She also has 6917 and 47 yo grown children who care for themselves.  She denies any other contacts with someone with mTB, all family members are in good health.  Outpatient Encounter Prescriptions as of 11/18/2016  Medication Sig  . aspirin 81 MG chewable tablet Chew 1 tablet (81 mg total) by mouth daily.  . folic acid (FOLVITE) 1 MG tablet Take 1 tablet (1 mg total) by mouth daily.  . hydrochlorothiazide  (HYDRODIURIL) 25 MG tablet Take 25 mg by mouth daily.  . Prenatal Vit-Fe Fumarate-FA (PRENATAL MULTIVITAMIN) TABS tablet Take 1 tablet by mouth daily at 12 noon.   No facility-administered encounter medications on file as of 11/18/2016.      Patient Active Problem List   Diagnosis Date Noted  . History of tuberculosis 10/22/2016  . Axillary adenopathy 09/24/2016  . AMA (advanced maternal age) multigravida 35+ 09/16/2016  . Abnormal quad screen 09/08/2016  . Supervision of high-risk pregnancy of elderly multigravida 09/03/2016  . Dichorionic diamniotic twin gestation 09/03/2016  . Chronic hypertension 09/03/2016  . Obesity in pregnancy 09/03/2016     Health Maintenance Due  Topic Date Due  . PAP SMEAR  12/09/1990  . INFLUENZA VACCINE  07/14/2016    Family hx:  No mTB in family  Review of Systems  Constitutional: +fatigue. Negative for fever, chills, diaphoresis, activity change, appetite change, fatigue and unexpected weight change.  HENT: Negative for congestion, sore throat, rhinorrhea, sneezing, trouble swallowing and sinus pressure.  Eyes: Negative for photophobia and visual disturbance.  Respiratory: Negative for cough, chest tightness, shortness of breath, wheezing and stridor.  Cardiovascular: Negative for chest pain, palpitations and leg swelling.  Gastrointestinal: Negative for nausea, vomiting, abdominal pain, diarrhea, constipation, blood in stool, abdominal distention and anal bleeding.  Genitourinary: Negative for dysuria, hematuria, flank pain and difficulty urinating.  Musculoskeletal: Negative for myalgias, back pain, joint swelling, arthralgias and gait problem.  Skin: Negative for color change, pallor, rash and wound.  Neurological: Negative for dizziness, tremors, weakness and light-headedness.  Hematological: Negative for adenopathy. Does not bruise/bleed easily.  Psychiatric/Behavioral: Negative for behavioral problems, confusion, sleep disturbance,  dysphoric mood, decreased concentration and agitation.    Physical Exam   BP 135/74   Pulse 98   Temp 97.8 F (36.6 C) (Oral)   Wt 191 lb (86.6 kg)   LMP 05/01/2016 (Exact Date)   BMI 29.91 kg/m   Physical Exam  Constitutional:  oriented to person, place, and time. appears well-developed and well-nourished. No distress.  HENT: [place]/AT, PERRLA, no scleral icterus Mouth/Throat: Oropharynx is clear and moist. No oropharyngeal exudate.  Cardiovascular: Normal rate, regular rhythm and normal heart sounds. Exam reveals no gallop and no friction rub.  No murmur heard.  Pulmonary/Chest: Effort normal and breath sounds normal. No respiratory distress.  has no wheezes.  Chest = bloody drainage from left nipple Neck = supple, no nuchal rigidity Abdominal: Soft. Bowel sounds are normal.  exhibits no distension. There is no tenderness. Gravid abdomen Lymphadenopathy: no cervical adenopathy. No axillary adenopathy Neurological: alert and oriented to person, place, and time.  Skin: Skin is warm and dry. No rash noted. No erythema.  Psychiatric: a normal mood and affect.  behavior is normal.   CBC Lab Results  Component Value Date   WBC 6.8 11/12/2016   RBC 3.69 (L) 11/12/2016   HGB 10.9 (L) 11/12/2016   HCT 33.2 (L) 11/12/2016   PLT 188 11/12/2016   MCV 90.0 11/12/2016   MCH 29.5 11/12/2016   MCHC 32.8 11/12/2016   RDW 14.2 11/12/2016   Radiology: Study Result   CLINICAL DATA:  45 year old patient presents for six-month ultrasound follow-up of left axillary lymphadenopathy. In discussion with the patient today, she reports no known chronic illnesses or subacute illnesses 2 definitely explain lymphadenopathy. She is currently [redacted] weeks pregnant with twins.  EXAM: ULTRASOUND OF THE LEFT BREAST  COMPARISON:  April 01, 2016 and January 23, 2016  FINDINGS: On physical exam, I do not palpate lymphadenopathy in the left axilla.  Targeted ultrasound is performed, showing what  appears to be 2 immediately adjacent, nearly inseparable lymph nodes measuring in conglomerate of to 3.2 x 1.8 x 1.4 cm. This lymph node (a) demonstrate diffuse cortical thickening. The larger of the 2 lymph nodes measures 2.5 cm in greatest length, and cortical thickness measures up to 7 mm. The fatty hila are present, but appear slightly attenuated.  For comparison purposes, ultrasound of the right axilla was performed. A right axillary lymph node, within normal limits is imaged. No lymphadenopathy is detected.  IMPRESSION: Persistent left axillary lymphadenopathy with an unknown cause. Cortical thickening of the prominent lymph node(s) measures up to 7 mm.  RECOMMENDATION: Ultrasound-guided core needle biopsy is suggested to exclude malignancy or lymphoproliferative process. Biopsy is being scheduled for the patient.  I have discussed the findings and recommendations with the patient. Results were also provided in writing at the conclusion of the visit. If applicable, a reminder letter will be sent to the patient regarding the next appointment.   Path on 10/9 Lymph node, axilla and upper limb, left - FOCALLY NECROTIZING GRANULOMATOUS LYMPHADENITIS. - NO TUMOR SEEN. - SEE COMMENT. Microscopic Comment Sections demonstrate benign lymph node tissue with embedded granulomas. Focally, necrobiotic debris is present. An AFB stain is performed which is negative for acid fast bacilli. A Gram stain is performed which is negative for bacterial organisms. A PAS stain and GMS stain are performed without definitive evidence of fungal infection (see  below comment). Although special stains are not positive for microorganisms, special stains are not as sensitive as other microbiologic techniques (including culture). If a search for microorganisms is ultimately negative, the findings may represent systemic granulomatous disease (such as sarcoidosis). Preliminary findings are discussed with  Dr. Alysia PennaErvin on 09/24/16. Per his request a block will be sent for PCR for evaluation of tuberculosis. The final result is [facility] on 09/25/16. Comment about the GMS stain: Of note, the initial GMS stain demonstrates background contaminate (different microorganisms some of which appear to be away from the lymph node tissue). As this is the case, a GMS is repeated on both blocks. Both repeat stains do not show definitive fungal organisms. The findings are best reported as above. Dr. Valinda HoarJulia Manny [facility] in consultation with agreement that the GMS stains are best reported as negative. (RAH:gt, 09/25/16)  10/21 U W mTB complex PCR negative  Assessment and Plan - will send bloody breast fluid for AFB culture - since [place], will not be able to arrange for excisional biopsy but recommend that she does that once she gets there, we discussed postponing her trip but she felt she was unable to do so. - agree with starting mTB treatment, she plans to get 2nd dose today. [facility] will arrange for local health dept in [place] to do further DOT - will follow up with results to patient - if she returns back to [place], will help with managmeent of care for presumed mtB lymphadenitis

## 2016-11-27 ENCOUNTER — Encounter: Payer: Medicaid Other | Admitting: Family Medicine

## 2016-12-08 ENCOUNTER — Encounter (HOSPITAL_COMMUNITY): Payer: Self-pay

## 2016-12-08 ENCOUNTER — Ambulatory Visit (HOSPITAL_COMMUNITY): Payer: Medicaid Other

## 2017-01-04 LAB — AFB CULTURE WITH SMEAR (NOT AT ARMC)

## 2017-06-03 IMAGING — US US MFM OB DETAIL EACH ADDL GEST+14 WK
1 series · 12 of 28 positions shown · non-contrast
Comparison: none

[Series 1: us mfm ob detail each addl gest+14 wk · 12 of 217 slices shown]
[im 9/217]
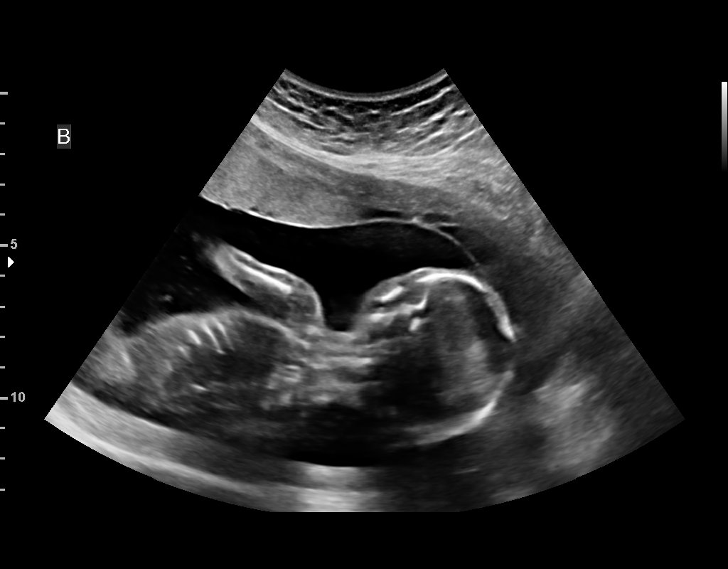
[im 25/217]
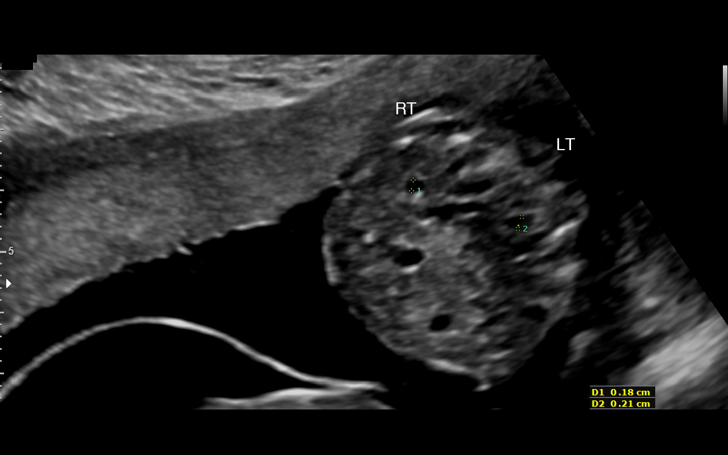
[im 41/217]
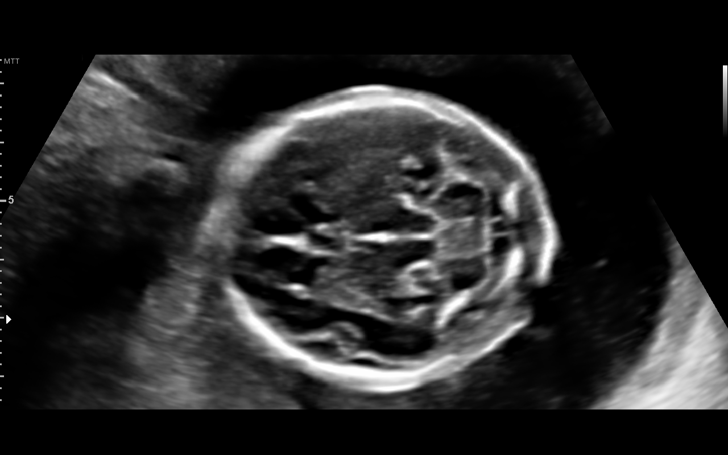
[im 65/217]
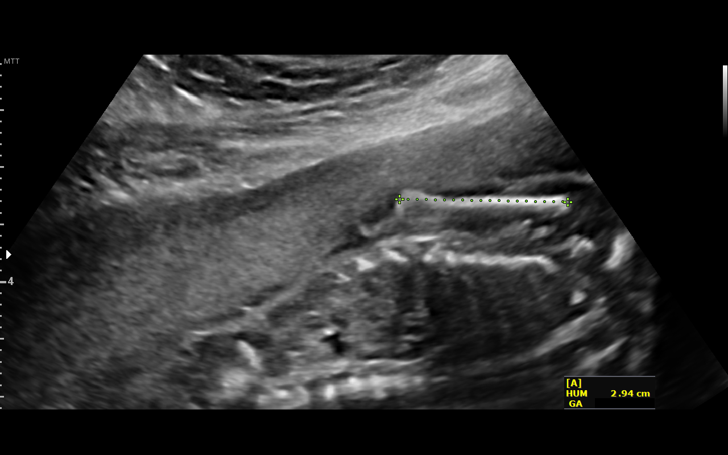
[im 81/217]
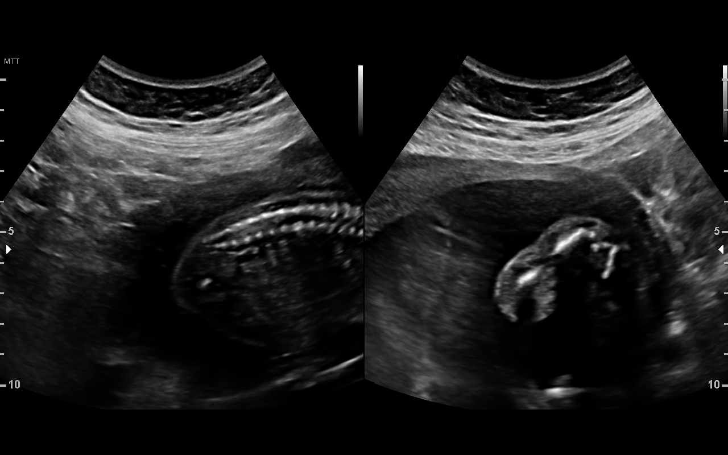
[im 97/217]
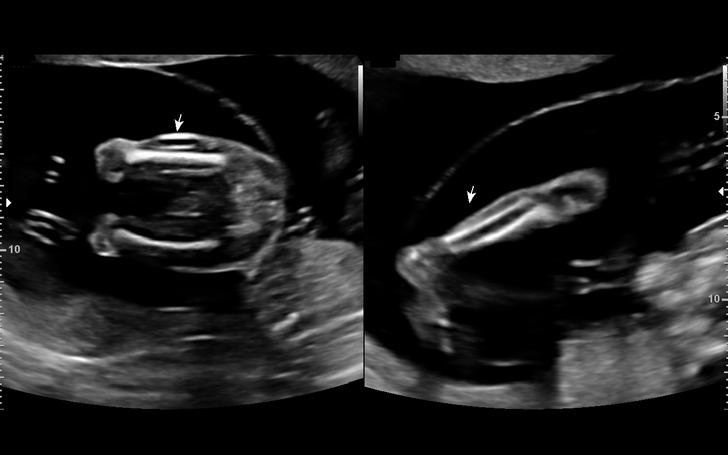
[im 121/217]
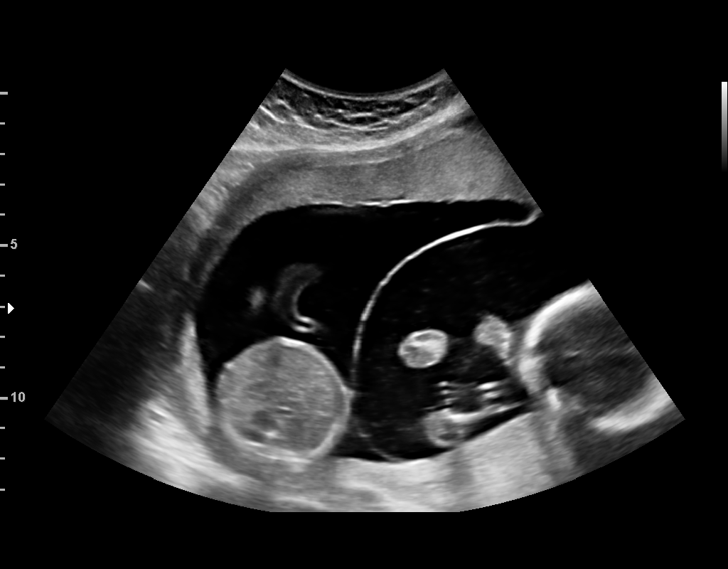
[im 137/217]
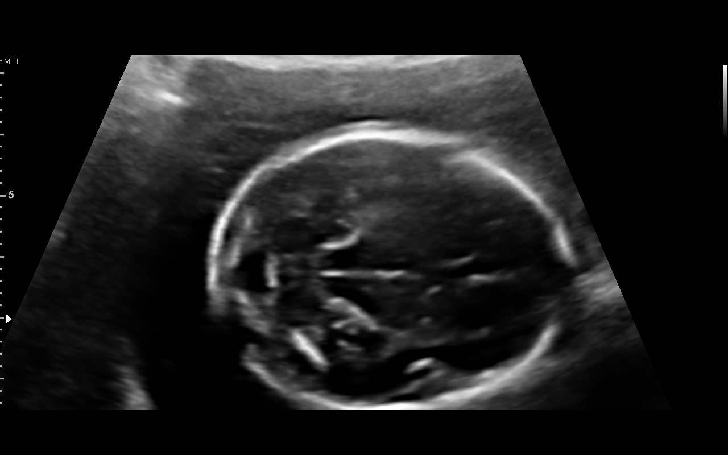
[im 153/217]
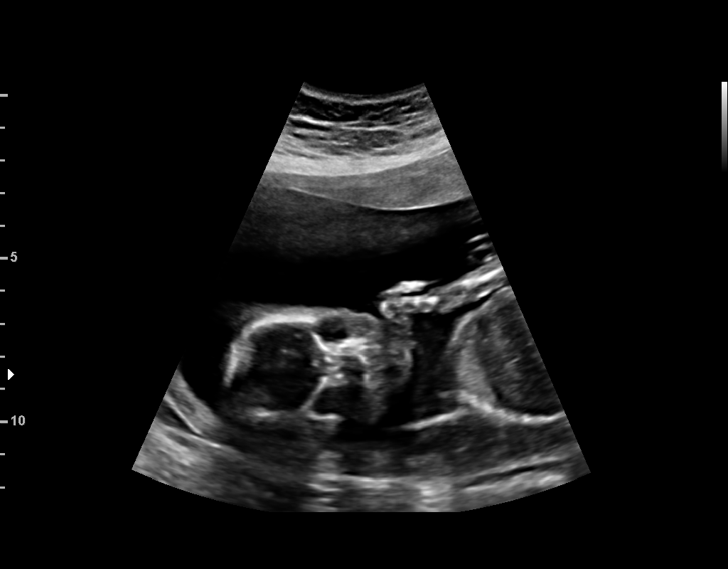
[im 177/217]
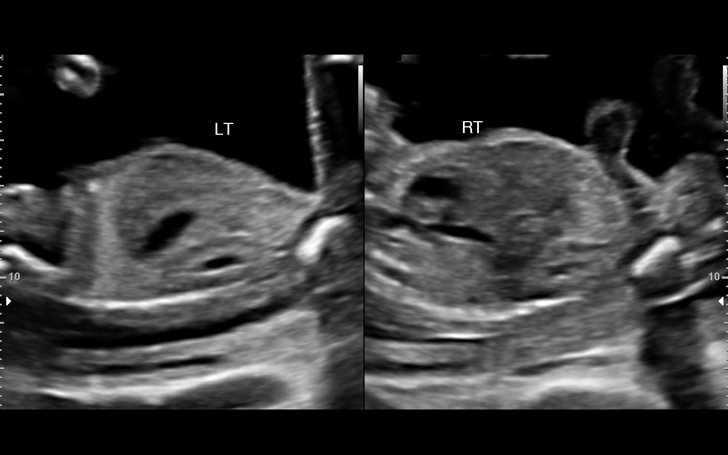
[im 193/217]
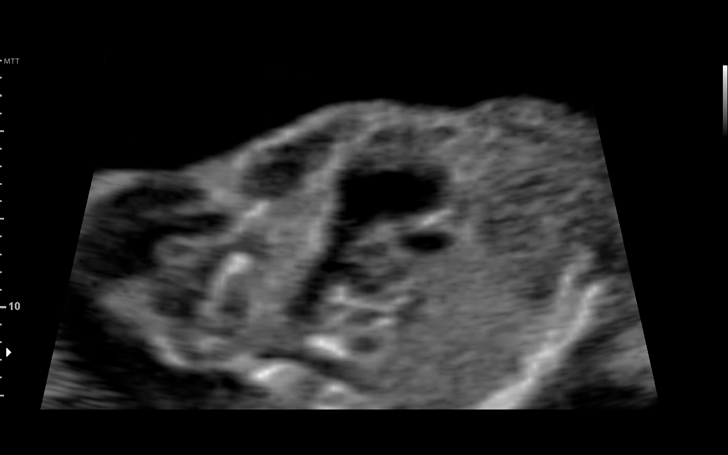
[im 209/217]
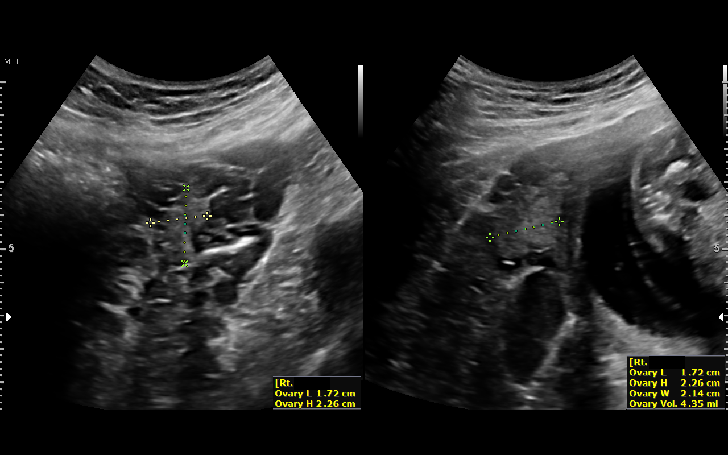

[12 of 28 positions shown; findings below may reference images not displayed]

WK

1  JUCIMARIA VARELO          [id number]       [id number]     [id number]
2  JUCIMARIA VARELO          [id number]       [id number]     [id number]
Indications

19 weeks gestation of pregnancy
Twin pregnancy, di/di, second trimester
Advanced maternal age multigravida 35+
(44), second trimester; low risk NIPS
Hypertension - Chronic/Pre-existing
Antenatal screening for malformations
OB History

Blood Type:            Height:  5'7"   Weight (lb):  185      BMI:
Gravidity:    7         Term:   5        Prem:   0        SAB:   1
TOP:          0       Ectopic:  0        Living: 4
Fetal Evaluation (Fetus A)

Num Of Fetuses:     2
Fetal Heart         133
Rate(bpm):
Cardiac Activity:   Observed
Fetal Lie:          Inferior Left
Presentation:       Variable
Placenta:           Anterior, above cervical os
P. Cord Insertion:  Visualized, central
Membrane Desc:      Dividing Membrane seen

Amniotic Fluid
AFI FV:      Subjectively within normal limits

Largest Pocket(cm)
5.2
Biometry (Fetus A)

BPD:      46.9  mm     G. Age:  20w 1d         75  %    CI:        79.63   %   70 - 86
FL/HC:      18.7   %   16.8 -
HC:      166.1  mm     G. Age:  19w 2d         30  %    HC/AC:      1.08       1.09 -
AC:      154.3  mm     G. Age:  20w 4d         78  %    FL/BPD:     66.3   %
FL:       31.1  mm     G. Age:  19w 4d         46  %    FL/AC:      20.2   %   20 - 24
HUM:      29.6  mm     G. Age:  19w 5d         55  %
CER:      20.7  mm     G. Age:  19w 5d         52  %
NFT:       3.7  mm
CM:          3  mm

Est. FW:     331  gm    0 lb 12 oz      55  %     FW Discordancy      0 \ 8 %
Gestational Age (Fetus A)

LMP:           19w 4d       Date:   05/01/16                 EDD:   02/05/17
U/S Today:     19w 6d                                        EDD:   02/03/17
Best:          19w 4d    Det. By:   LMP  (05/01/16)          EDD:   02/05/17
Anatomy (Fetus A)

Cranium:               Appears normal         Aortic Arch:            Appears normal
Cavum:                 Appears normal         Ductal Arch:            Appears normal
Ventricles:            Appears normal         Diaphragm:              Appears normal
Choroid Plexus:        Appears normal         Stomach:                Appears normal, left
sided
Cerebellum:            Appears normal         Abdomen:                Appears normal
Posterior Fossa:       Appears normal         Abdominal Wall:         Appears nml (cord
insert, abd wall)
Nuchal Fold:           Appears normal         Cord Vessels:           Appears normal (3
vessel cord)
Face:                  Appears normal         Kidneys:                Appear normal
(orbits and profile)
Lips:                  Appears normal         Bladder:                Appears normal
Thoracic:              Appears normal         Spine:                  Appears normal
Heart:                 Appears normal         Upper Extremities:      Appears normal
(4CH, axis, and situs
RVOT:                  Appears normal         Lower Extremities:      Appears normal
LVOT:                  Appears normal

Other:  Fetus appears to be a male. Heels and 5th digit visualized. Nasal
bone visualized.

Fetal Evaluation (Fetus B)

Num Of Fetuses:     2
Fetal Heart         159
Rate(bpm):
Cardiac Activity:   Observed
Fetal Lie:          Superior Right
Presentation:       Variable
Placenta:           Anterior, above cervical os
P. Cord Insertion:  Visualized, central
Membrane Desc:      Dividing Membrane seen

Amniotic Fluid
AFI FV:      Subjectively within normal limits
Largest Pocket(cm)
7.8
Biometry (Fetus B)

BPD:      45.2  mm     G. Age:  19w 5d         54  %    CI:        74.59   %   70 - 86
FL/HC:      17.6   %   16.8 -
HC:      166.1  mm     G. Age:  19w 2d         30  %    HC/AC:      1.10       1.09 -
AC:      150.8  mm     G. Age:  20w 2d         69  %    FL/BPD:     64.6   %
FL:       29.2  mm     G. Age:  19w 0d         23  %    FL/AC:      19.4   %   20 - 24
HUM:      29.3  mm     G. Age:  19w 4d         52  %
CER:      19.8  mm     G. Age:  18w 6d         36  %
NFT:       3.9  mm
CM:        4.7  mm

Est. FW:     305  gm    0 lb 11 oz      49  %     FW Discordancy         8  %
Gestational Age (Fetus B)

LMP:           19w 4d       Date:   05/01/16                 EDD:   02/05/17
U/S Today:     19w 4d                                        EDD:   02/05/17
Best:          19w 4d    Det. By:   LMP  (05/01/16)          EDD:   02/05/17
Anatomy (Fetus B)

Cranium:               Appears normal         Aortic Arch:            Appears normal
Cavum:                 Appears normal         Ductal Arch:            Appears normal
Ventricles:            Appears normal         Diaphragm:              Appears normal
Choroid Plexus:        Appears normal         Stomach:                Appears normal, left
sided
Cerebellum:            Appears normal         Abdomen:                Appears normal
Posterior Fossa:       Appears normal         Abdominal Wall:         Appears nml (cord
insert, abd wall)
Nuchal Fold:           Appears normal         Cord Vessels:           Appears normal (3
vessel cord)
Face:                  Appears normal         Kidneys:                Appear normal
(orbits and profile)
Lips:                  Not well visualized    Bladder:                Appears normal
Thoracic:              Appears normal         Spine:                  Appears normal
Heart:                 Appears normal         Upper Extremities:      Appears normal
(4CH, axis, and situs
RVOT:                  Appears normal         Lower Extremities:      Appears normal
LVOT:                  Appears normal

Other:  Fetus appears to be a male. Heels and 5th digit visualized. Nasal
bone visualized.
Cervix Uterus Adnexa

Cervix
Length:           3.39  cm.
Normal appearance by transabdominal scan.

Uterus
No abnormality visualized.

Left Ovary
Size(cm)     2.22  x    2.24   x  2.1       Vol(ml):
Within normal limits. No adnexal mass visualized.
Right Ovary
Size(cm)     1.72  x    2.14   x  2.26      Vol(ml):
Within normal limits. No adnexal mass visualized.

Cul De Sac:   No free fluid seen.

Adnexa:       No abnormality visualized.
Impression

Dichorionic/diamniotic twin pregnancy at 19+4 weeks
Normal detailed fetal anatomy; Twin B: limited views of upper
lip
Markers of aneuploidy: none
Normal amniotic fluid volume
Measurements consistent with LMP dating
Recommendations

Please see genetic counseling note
Follow-up ultrasound for growth in 4-6 weeks

## 2017-07-09 ENCOUNTER — Encounter (HOSPITAL_COMMUNITY): Payer: Self-pay

## 2018-10-06 ENCOUNTER — Encounter: Payer: Self-pay | Admitting: *Deleted

## 2019-05-11 ENCOUNTER — Encounter: Payer: Self-pay | Admitting: *Deleted
